# Patient Record
Sex: Female | Born: 1955 | Race: White | Hispanic: No | Marital: Married
Health system: Northeastern US, Academic
[De-identification: ages and names within clinical notes are randomized; demographics above are authoritative.]

---

## 2016-02-22 LAB — CREATININE W/ EGFR (EXT)
Creatinine (EXT): 0.73 mg/dL (ref 0.50–1.20)
GFR Estimated (Calc) (EXT): >59 mL/min/{1.73_m2} (ref >59)

## 2016-10-14 LAB — CREATININE W/ EGFR (EXT)
Creatinine (EXT): 0.8 mg/dL (ref 0.50–1.20)
GFR Estimated (Calc) (EXT): 81 mL/min/{1.73_m2} (ref >59)

## 2017-08-28 LAB — BMP (EXT)
Anion Gap (EXT): 11 mmol/L (ref 7–17)
BUN (EXT): 13 mg/dL (ref 6–23)
CO2 (EXT): 25 mmol/L (ref 22–31)
CalciumCalcium (EXT): 10.2 mg/dL (ref 8.8–10.7)
Chloride (EXT): 104 mmol/L (ref 98–107)
Creatinine (EXT): 0.77 mg/dL (ref 0.50–1.20)
GFR Estimated (Calc) (EXT): 84 mL/min/{1.73_m2} (ref >59)
Glucose (EXT): 81 mg/dL (ref 70–100)
Potassium (EXT): 4.3 mmol/L (ref 3.4–5.1)
Sodium (EXT): 140 mmol/L (ref 136–145)

## 2019-05-20 LAB — UNMAPPED LAB RESULTS: CalciumCalcium (EXT): 10.1 mg/dL (ref 8.8–10.7)

## 2019-12-28 LAB — BMP (EXT)
Anion Gap (EXT): 7 mmol/L (ref 7–17)
BUN (EXT): 16 mg/dL (ref 6–23)
CO2 (EXT): 28 mmol/L (ref 22–31)
CalciumCalcium (EXT): 10.4 mg/dL (ref 8.8–10.7)
Chloride (EXT): 106 mmol/L (ref 98–107)
Creatinine (EXT): 0.78 mg/dL (ref 0.50–1.20)
GFR Estimated (Calc) (EXT): 81 mL/min/{1.73_m2} (ref >59)
Glucose (EXT): 75 mg/dL (ref 70–100)
Potassium (EXT): 4 mmol/L (ref 3.4–5.1)
Sodium (EXT): 141 mmol/L (ref 136–145)

## 2020-09-14 LAB — BMP (EXT)
Anion Gap (EXT): 6 mmol/L — ABNORMAL LOW (ref 7–17)
BUN (EXT): 20 mg/dL (ref 6–23)
CO2 (EXT): 29 mmol/L (ref 22–31)
CalciumCalcium (EXT): 10.4 mg/dL (ref 8.8–10.7)
Chloride (EXT): 106 mmol/L (ref 98–107)
Creatinine (EXT): 0.8 mg/dL (ref 0.50–1.20)
GFR Estimated (Calc) (EXT): 83 mL/min/{1.73_m2} (ref >59)
Glucose (EXT): 89 mg/dL (ref 70–100)
Potassium (EXT): 4.1 mmol/L (ref 3.4–5.1)
Sodium (EXT): 141 mmol/L (ref 136–145)

## 2021-01-15 ENCOUNTER — Encounter

## 2021-01-15 ENCOUNTER — Encounter (HOSPITAL_BASED_OUTPATIENT_CLINIC_OR_DEPARTMENT_OTHER): Admitting: Dermatology

## 2021-01-31 ENCOUNTER — Encounter (HOSPITAL_BASED_OUTPATIENT_CLINIC_OR_DEPARTMENT_OTHER)

## 2021-01-31 ENCOUNTER — Ambulatory Visit (HOSPITAL_BASED_OUTPATIENT_CLINIC_OR_DEPARTMENT_OTHER): Admitting: Dermatology

## 2021-04-09 ENCOUNTER — Encounter (HOSPITAL_BASED_OUTPATIENT_CLINIC_OR_DEPARTMENT_OTHER)

## 2021-04-09 ENCOUNTER — Encounter

## 2021-04-09 ENCOUNTER — Telehealth (HOSPITAL_BASED_OUTPATIENT_CLINIC_OR_DEPARTMENT_OTHER): Admitting: Gastroenterology

## 2021-04-09 NOTE — Telephone Encounter (Signed)
Del from Dr Gretchen Short office call req app for this pt, DR.Isaias Cowman wants DR,Rahaman to see this pt they are going to fax the referral to this department, pt # [phone], thank you

## 2021-04-12 ENCOUNTER — Other Ambulatory Visit (HOSPITAL_BASED_OUTPATIENT_CLINIC_OR_DEPARTMENT_OTHER)

## 2021-04-12 ENCOUNTER — Other Ambulatory Visit (HOSPITAL_BASED_OUTPATIENT_CLINIC_OR_DEPARTMENT_OTHER): Admitting: Gastroenterology

## 2021-04-19 ENCOUNTER — Telehealth (HOSPITAL_BASED_OUTPATIENT_CLINIC_OR_DEPARTMENT_OTHER): Admitting: Gastroenterology

## 2021-04-19 NOTE — Telephone Encounter (Signed)
Pt is calling to reschedule her appt for 04/29/2021 @9 :20 a.m she said that appt is to early coming from [facility] she can do a late morning appt if possible. pt cares for her father bc [phone] thank you

## 2021-04-24 NOTE — Telephone Encounter (Signed)
Please c/b pt to reschedule, she can not make that Monday, any other day is better, thanks

## 2021-04-24 NOTE — Telephone Encounter (Signed)
pt is returning her miss call from the clinic is returning call from [facility] [phone] thank you

## 2021-04-24 NOTE — Telephone Encounter (Signed)
lvm to call back to reschedule appt

## 2021-04-29 ENCOUNTER — Ambulatory Visit: Payer: MEDICARE | Attending: Gastroenterology

## 2021-06-17 ENCOUNTER — Ambulatory Visit: Payer: MEDICARE | Attending: Gastroenterology

## 2021-07-08 ENCOUNTER — Ambulatory Visit: Payer: MEDICARE | Attending: Gastroenterology

## 2021-07-08 ENCOUNTER — Ambulatory Visit: Payer: MEDICARE | Attending: Family

## 2021-07-17 ENCOUNTER — Ambulatory Visit: Payer: MEDICARE | Attending: Gastroenterology

## 2021-07-17 ENCOUNTER — Encounter

## 2021-08-07 ENCOUNTER — Ambulatory Visit: Payer: MEDICARE | Attending: Otolaryngology

## 2021-08-07 ENCOUNTER — Ambulatory Visit: Payer: MEDICARE

## 2022-04-19 LAB — BMP (EXT)
Anion Gap (EXT): 7 mmol/L (ref 7–17)
BUN (EXT): 20 mg/dL (ref 6–23)
CO2 (EXT): 27 mmol/L (ref 22–31)
CalciumCalcium (EXT): 10.3 mg/dL (ref 8.8–10.7)
Chloride (EXT): 106 mmol/L (ref 98–107)
Creatinine (EXT): 0.71 mg/dL (ref 0.50–1.20)
GFR Estimated (Calc) (EXT): 95 mL/min/{1.73_m2} (ref >59)
Glucose (EXT): 96 mg/dL (ref 70–100)
Potassium (EXT): 4.4 mmol/L (ref 3.4–5.1)
Sodium (EXT): 140 mmol/L (ref 136–145)

## 2022-07-10 ENCOUNTER — Institutional Professional Consult (permissible substitution): Admit: 2022-07-10 | Discharge: 2022-07-10 | Payer: MEDICARE | Attending: Audiologist

## 2022-07-10 ENCOUNTER — Ambulatory Visit: Admit: 2022-07-10 | Discharge: 2022-07-10 | Payer: MEDICARE | Attending: Otolaryngology

## 2022-07-10 DIAGNOSIS — H9202 Otalgia, left ear: Secondary | ICD-10-CM

## 2022-07-10 DIAGNOSIS — H93293 Other abnormal auditory perceptions, bilateral: Secondary | ICD-10-CM

## 2022-07-10 NOTE — Progress Notes (Signed)
Name: Shelly Thompson  DOB: 1955-03-03  Age: 67 y.o.  Date of Evaluation: 07/10/2022    REASON FOR VISIT: Shelly Thompson is seen today for an audiologic evaluation in conjunction with Otolaryngologist, Dr. Lorenda Cahill, secondary to otalgia and vertigo. Patient reported intermittent otalgia (L>R) since having her left ear cleaned under anesthesia ~7 years ago. 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 Patient reported room-spinning vertigo that is positional in nature for several years. She denied otorrhea, tinnitus, hearing concerns, and history of noise exposure. Patient reported a family history of hearing loss in her father secondary to noise exposure.    EVALUATION  See audiogram    AUDIOMETRIC FINDINGS:  Otoscopic Evaluation:  Right Ear: clear ear canal with tympanic membrane visualized  Left Ear: clear ear canal with tympanic membrane visualized    Immitance Measures: 226 Hz  Right Ear: Normal ear canal volume, middle ear pressure, and tympanic membrane mobility, consistent with normal middle ear function  Left Ear: Normal ear canal volume, middle ear pressure, and tympanic membrane mobility, consistent with normal middle ear function    Acoustic reflexes were deferred as patient reports hyperacusis as well as headache today.    Test Technique: Pure Tone Audiometry via headphones    Reliability: Good    Pure Tone Audiometry: Results obtained today suggest hearing within normal limits from 0.25-8 kHz in both ears.    Speech Audiometry:  Right Ear: Speech Recognition Threshold (SRT) was obtained at 15 dBHL Word recognition score was excellent at a normal conversational speech level  Left Ear: Speech Recognition Threshold (SRT) was obtained at 15 dBHL Word recognition score was excellent at a normal conversational speech level    IMPRESSIONS:  Hearing sensitivity appears adequate for communication needs at this time.     RECOMMENDATIONS:  Follow-up with Dr. Lorenda Cahill as scheduled for today.   Repeat audiologic  evaluation per otologic plan of care, or sooner if a change in hearing is noted.    Today's results and recommendations were discussed with the patient, who verbalized an understanding and is in agreement with the plan of care. Questions were addressed and the patient was encouraged to contact our department should concerns arise.      Dustin Folks, AuD, Landscape architect  [facility]  [address], [address]

## 2022-07-10 NOTE — Progress Notes (Signed)
DEPARTMENT OF OTOLARYNGOLOGY - HEAD AND NECK SURGERY  [address]  [address]  [place] [address]    Date of Service: 07/10/2022  Patient: Shelly Thompson  DOB: 11-May-1955  MRN#: [id number]    History of Present Illness:  Shelly Thompson is a 67 y.o. female who presents for evaluation of ear discomfort. SHe reports having to have her left cleaned under anesthesia in 2016.  Since that time she has had chronic left ear discomfort and fullness with intermittent otalgia. SHe also reports episodes of positional vertigo, occurring several times per week for many years.  SHe denies hearing change with her vertigo. She has a hx of migraine and motion intolerance  Patient denies : hearing loss and tinnitus    History reviewed. No pertinent past medical history.  History reviewed. No pertinent surgical history.  Social History     Tobacco Use    Smoking status: Not on file    Smokeless tobacco: Not on file   Substance Use Topics    Alcohol use: Not on file     No family history on file.    Allergies:  Latex, Prednisone, Gluten protein, and Peanut    Medications:  Current Outpatient Medications   Medication Instructions    acetaminophen (TYLENOL 8 HOUR) 650 mg, oral, Every 8 hours PRN    esomeprazole (NexIUM) 20 mg DR capsule 1 capsule, oral, Daily    fexofenadine (Allegra Allergy) 180 mg tablet 1 tablet, oral, Daily    fluconazole (Diflucan) 150 mg tablet TAKE 1 TABLET BY MOUTH EVERY DAY AS DIRECTED FOR 1 DAY    folic acid (FOLVITE) 1,333 mcg, oral, Daily RT    levothyroxine (SYNTHROID, LEVOXYL) 125 mcg, oral, Daily RT    lithium ER (LITHOBID) 600 mg, oral, Daily RT    magnesium 100 mg, oral, Daily RT    metroNIDAZOLE (Metrocream) 0.75 % cream Topical, 2 times daily    minocycline 100 mg capsule oral, Daily    rosuvastatin (CRESTOR) 5 mg, oral, Daily    therapeutic multivitamin-iron-minerals (Theragran-M) tablet 1 tablet, oral, Daily RT       Physical exam:  Constitutional: Well appearing, in no acute distress.  Communicates easily and understands our conversation.  Neurotology exam: A&Ox3. Facial strength HB grade I. EOMI. CN II-XII grossly intact. EOMI. Normal FTN. Head thrust without nystagmus. Rhomberg test negative. Fukuda stepping test without rotation or drifting. Dix-hallpike maneuver negative for nystagmus but she complains of vertigo in left ear down position  Ears: Examined with otomicroscopy  Right: Pinna normal, ear canal normal, TM intact and normal, Rinne +  Left: Pinna normal, ear canal normal, TM intact and normal, Rinne+  Weber midline    Eyes: Normal lids and periorbital structures.   Neck: No cervical adenopathy or masses  Musculoskeletal: Left posterior neck stiffness/tenderness. No TMJ tenderness  Respiratory: No respiratory distress, no stridor, or nasal flaring  Psychiatric: Normal behavior. Cooperative with exam    Review of Data/Medical Documents:  Audiogram was personally reviewed, which showed normal hearing bilaterally    Procedures:    Assessment/Plan:  1. Left ear pain  Referred from cervicalgia    2. Cervicalgia    - Ambulatory referral to Physical Therapy; Future    3. Vertigo  If not improving with PT, we will consider vestibular testing      Follow up in about 2 months (around 09/09/2022) for follow up, sooner as needed.      Elijio Miles, MD  Otology, Neurotology and  [facility]  P: [phone]  F: [phone]

## 2022-09-11 ENCOUNTER — Ambulatory Visit: Payer: MEDICARE | Attending: Otolaryngology

## 2022-10-03 NOTE — Telephone Encounter (Signed)
Patient calling in to cancel 9/12 appointment with Dr. Lorenda Cahill.     Please advise.  Patient has been having hip problems and will be having surgery on October 25, and would need to R/S for the new year.   Patient has been going to the massages and has been helping vertigo and neck and ear pain has improved.    Patient has been rescheduled to 03/04/23 at 11am with Dr. Lorenda Cahill      Call back: [phone]

## 2022-10-09 ENCOUNTER — Ambulatory Visit: Payer: MEDICARE | Attending: Otolaryngology

## 2023-01-05 LAB — UNMAPPED LAB RESULTS: CalciumCalcium (EXT): 10.1 mg/dL (ref 8.8–10.7)

## 2023-01-14 ENCOUNTER — Ambulatory Visit: Admit: 2023-01-14 | Discharge: 2023-01-14 | Payer: MEDICARE | Attending: Otolaryngology

## 2023-01-14 DIAGNOSIS — R42 Dizziness and giddiness: Secondary | ICD-10-CM

## 2023-01-14 NOTE — Progress Notes (Signed)
DEPARTMENT OF OTOLARYNGOLOGY - HEAD AND NECK SURGERY  [address]  [address]  [place] [address]    Date of Service: 01/14/2023  Patient: Shelly Thompson  DOB: 1956-01-12  MRN#: [id number]    History of Present Illness:  Shelly Thompson is a 67 y.o. female who returns for dizziness and ear discomfort. She tried massage therapy and acupuncture for her neck which helped significantly. She was unable to find a physical therapist. However she stopped going 7 weeks ago due to hip surgery. Of note, she is going to PT for her hip. She reports intermittent "phantom smells" of cigarette smoke, which started 1.5 years ago after she had COVID infection. She states the last episode was around 3 days ago. She states her mother had a neurologic condition which she states was generalized dystonia versus corticobasilar syndrome versus apraxia verus Parkinson's. She has a hx of migraine and motion intolerance.     History reviewed. No pertinent past medical history.  History reviewed. No pertinent surgical history.  Social History     Tobacco Use    Smoking status: Not on file    Smokeless tobacco: Not on file   Substance Use Topics    Alcohol use: Not on file     No family history on file.    Allergies:  Latex, Prednisone, Gluten protein, and Peanut    Medications:  Current Outpatient Medications   Medication Instructions    acetaminophen (TYLENOL 8 HOUR) 650 mg, oral, Every 8 hours PRN    esomeprazole (NexIUM) 20 mg DR capsule 1 capsule, oral, Daily    fexofenadine (Allegra Allergy) 180 mg tablet 1 tablet, oral, Daily    fluconazole (Diflucan) 150 mg tablet TAKE 1 TABLET BY MOUTH EVERY DAY AS DIRECTED FOR 1 DAY    folic acid (FOLVITE) 1,333 mcg, oral, Daily RT    levothyroxine (SYNTHROID, LEVOXYL) 125 mcg, oral, Daily RT    lithium ER (LITHOBID) 600 mg, oral, Daily RT    magnesium 100 mg, oral, Daily RT    metroNIDAZOLE (Metrocream) 0.75 % cream Topical, 2 times daily    minocycline 100 mg capsule oral, Daily     rosuvastatin (CRESTOR) 5 mg, oral, Daily    therapeutic multivitamin-iron-minerals (Theragran-M) tablet 1 tablet, oral, Daily RT       Physical exam:  Constitutional: Well appearing, in no acute distress. Communicates easily and understands our conversation.  Neurotology exam: A&Ox3. Facial strength HB grade I. CN II-XII grossly intact. EOMI. Normal FTN. Head thrust without nystagmus. Rhomberg test negative. Fukuda stepping test without rotation or drifting. Dix-hallpike maneuver negative for nystagmus but she complains of dizziness in left ear down position  Ears: Examined with otomicroscopy  Right: Pinna normal, ear canal normal, TM intact and normal  Left: Pinna normal, ear canal normal, TM intact and normal  Eyes: Normal lids and periorbital structures.   Neck: No cervical adenopathy or masses  Musculoskeletal: Left posterior neck stiffness/tenderness.   Respiratory: No respiratory distress, no stridor, or nasal flaring  Psychiatric: Normal behavior. Cooperative with exam    Review of Data/Medical Documents:  Audiogram was personally reviewed, which showed normal hearing bilaterally    Procedures:    Assessment/Plan:  1. Dizziness  Most likely cervicogenic    2. Cervicalgia  Recommend continue massage therapy. She could consider PT however she is not interested at this time.     3. Left ear pain  Most likely cervicogenic, continue treatment    4. Smell disturbance  Differential includes seizure disorder. Recommended she follow up with Neurology for further evaluation. She states she is unable to tolerate MRI due to claustrophobia, will defer imaging recommendations to Neurology.     Follow up if symptoms worsen or fail to improve.      Elijio Miles, MD  Otology, Neurotology and [facility] Surgery  [facility]  P: [phone]  F: [phone]

## 2023-01-15 LAB — UNMAPPED LAB RESULTS: CalciumCalcium (EXT): 10.3 mg/dL (ref 8.8–10.7)

## 2023-01-23 NOTE — Telephone Encounter (Signed)
Pt called in wanting to have the physical therapy order mentioned by Dr Lorenda Cahill to be faxed to [facility] attn: Riki Rusk, fax#[phone]    Pt's [phone]     Thank you

## 2023-01-30 NOTE — Telephone Encounter (Signed)
Patient calling back said she saw appointment in portal  and now it is canceled did  not know what was going on.she will be available to  be contacted after 11:30 am today.    Thank you     [phone] (home)

## 2023-01-30 NOTE — Telephone Encounter (Signed)
Department Name: ENT    Patient: Shelly Thompson  MRN: [id number]  Agent: Veva Holes    [facility] Scheduling Message    Patient requesting call back from: DR Lorenda Cahill    In regards to: patient been having ear pain and wanted advice before making appointment     Best call back number: [phone]

## 2023-02-02 NOTE — Telephone Encounter (Signed)
Patient calling back see needs an appointment still having ear pain hearing loss and getting dizziness. From the ear drum she thinks is. And there was appointment on 02/04/2023 that was canceled  she can not have an afternoon. She will be available  after 11:30 today    Thank you     [phone]

## 2023-02-04 ENCOUNTER — Ambulatory Visit: Payer: MEDICARE | Attending: Otolaryngology

## 2023-02-04 ENCOUNTER — Ambulatory Visit: Payer: MEDICARE

## 2023-02-04 NOTE — Telephone Encounter (Signed)
Patient with viral infection last week. PCP concerned for TM perforation on exam. Patient denies otalgia, otorrhea but does report ear fullness, crackling in ear, and distorted hearing. She is aware she may need to be added on for audio for next week's appt which was alrdy scheduled.

## 2023-02-12 ENCOUNTER — Ambulatory Visit: Admit: 2023-02-12 | Discharge: 2023-02-12 | Payer: MEDICARE | Attending: Otolaryngology

## 2023-02-12 DIAGNOSIS — H6591 Unspecified nonsuppurative otitis media, right ear: Secondary | ICD-10-CM

## 2023-02-12 NOTE — Progress Notes (Signed)
DEPARTMENT OF OTOLARYNGOLOGY - HEAD AND NECK SURGERY  [address]  [address]  [place] [address]    Date of Service: 02/12/2023  Patient: Shelly Thompson  DOB: Jun 06, 1955  MRN#: [id number]    History of Present Illness:  Shelly Thompson is a 68 y.o. female who presents for evaluation of right ear blockage/fullness and tinnitus in setting of a cold 10 days ago. She denies otalgia, otorrhea. She reports slight imbalance occasionally. She has been going to PT for her neck which has been improving her otalgia. She states her dizziness is also overall improved.       History reviewed. No pertinent past medical history.  History reviewed. No pertinent surgical history.  Social History     Tobacco Use    Smoking status: Not on file    Smokeless tobacco: Not on file   Substance Use Topics    Alcohol use: Not on file     No family history on file.    Allergies:  Latex, Prednisone, Gluten protein, and Peanut    Medications:  Current Outpatient Medications   Medication Instructions    acetaminophen (TYLENOL 8 HOUR) 650 mg, oral, Every 8 hours PRN    esomeprazole (NexIUM) 20 mg DR capsule 1 capsule, oral, Daily    fexofenadine (Allegra Allergy) 180 mg tablet 1 tablet, oral, Daily    fluconazole (Diflucan) 150 mg tablet TAKE 1 TABLET BY MOUTH EVERY DAY AS DIRECTED FOR 1 DAY    folic acid (FOLVITE) 1,333 mcg, oral, Daily RT    levothyroxine (SYNTHROID, LEVOXYL) 125 mcg, oral, Daily RT    lithium ER (LITHOBID) 600 mg, oral, Daily RT    magnesium 100 mg, oral, Daily RT    metroNIDAZOLE (Metrocream) 0.75 % cream Topical, 2 times daily    minocycline 100 mg capsule oral, Daily    rosuvastatin (CRESTOR) 5 mg, oral, Daily    therapeutic multivitamin-iron-minerals (Theragran-M) tablet 1 tablet, oral, Daily RT       Physical exam:  Constitutional: Well appearing, in no acute distress. Communicates easily and understands our conversation.  Neurotology exam: A&Ox3. Facial strength HB grade I.  Ears: Examined with  otomicroscopy  Right: Pinna normal, ear canal normal, TM intact with serous middle ear effusion  Left: Pinna normal, ear canal normal, TM intact and normal  Weber midline, Rinne AC>BC on the right    Eyes: Normal lids and periorbital structures.   Respiratory: No respiratory distress, no stridor, or nasal flaring  Psychiatric: Normal behavior. Cooperative with exam    Review of Data/Medical Documents:    Procedures:    Assessment/Plan:  1. Middle ear effusion, right  Recommend nasal saline. She states she is unable to tolerate Flonase. If does not resolve at follow up, could consider mx/tube. Will also obtain CT scan for further evaluation  - CT TEMPORAL BONES W CONTRAST; Future  - Creatinine, Serum; Future  - CT SINUS WO CONTRAST; Future  - Creatinine, Serum    2. Smell disturbance  She is unable to tolerate MRI due to claustrophobia, but will including contrast on imaging order to evaluate for any underlying lesion.       Follow up in about 4 weeks (around 03/12/2023) for visit with audiogram, sooner as needed.      Elijio Miles, MD  Otology, Neurotology and [facility] Surgery  [facility]  P: [phone]  F: [phone]

## 2023-02-17 NOTE — Telephone Encounter (Signed)
Patient is requesting for the office to schedule CT scan to be done at [facility] in [place], [place].    Best call back: [phone]

## 2023-02-17 NOTE — Telephone Encounter (Signed)
Patient called with an update that their sore throat is back and their nose/ear symptoms haven't improved. Patient is asking if they should be on an antibiotic because a few weeks ago that seemed to help their symptoms.    Pharmacy: [facility] on [address] in [place], [place]    Best call back: [phone]

## 2023-02-17 NOTE — Telephone Encounter (Addendum)
Patient called to reschedule panel appointment on 2/6 because they are not able to come then. First available panel appointment not until 4/9. Patient requests a call back to schedule because they can't wait until April. Patient prefers 10:30, 10:45 or 11am.    Best call back: [phone]

## 2023-02-18 NOTE — Telephone Encounter (Signed)
Sore throat since last Thursday, improved today. Did have brief episode of coughing, no fever. Observation is recommended at this time. She will reach out if symptoms worsen. Patient is in agreement with this plan.

## 2023-03-04 ENCOUNTER — Ambulatory Visit: Payer: MEDICARE | Attending: Otolaryngology

## 2023-03-05 ENCOUNTER — Ambulatory Visit: Payer: MEDICARE

## 2023-03-05 ENCOUNTER — Ambulatory Visit: Payer: MEDICARE | Attending: Otolaryngology

## 2023-03-18 ENCOUNTER — Ambulatory Visit: Admit: 2023-03-18 | Discharge: 2023-03-18 | Payer: MEDICARE | Attending: Audiologist

## 2023-03-18 ENCOUNTER — Ambulatory Visit: Admit: 2023-03-18 | Discharge: 2023-03-18 | Payer: MEDICARE | Attending: Otolaryngology

## 2023-03-18 DIAGNOSIS — H6591 Unspecified nonsuppurative otitis media, right ear: Secondary | ICD-10-CM

## 2023-03-18 DIAGNOSIS — H93293 Other abnormal auditory perceptions, bilateral: Secondary | ICD-10-CM

## 2023-03-18 NOTE — Progress Notes (Signed)
DEPARTMENT OF OTOLARYNGOLOGY - HEAD AND NECK SURGERY  [address]  [address]  [place] [address]    Date of Service: 03/18/2023  Patient: Shelly Thompson  DOB: 21-Nov-1955  MRN#: [id number]    History of Present Illness:  Shelly Thompson is a 68 y.o. female who presents for follow up of right middle ear effusion.    Last seen 02/12/23 for right ear blockage/fullness and tinnitus following cold 10 days prior.   Patient reports her right ear blockage has resolved. Hearing on the right subjectively improved. She denies pulsatile tinnitus, autiophony, vertigo or  imbalance. Has intermittent left otalgia for years. Intermittent slight vertigo and balance issues, improved with physical therapy.    Patient denies : hearing loss, change in hearing, ear blockage, tinnitus, otorrhea, migraine, and autophony    History reviewed. No pertinent past medical history.  History reviewed. No pertinent surgical history.  Social History     Tobacco Use    Smoking status: Never    Smokeless tobacco: Never   Substance Use Topics    Alcohol use: Never     No family history on file.    Allergies:  Latex; Lurasidone hcl; Peanut; Prednisone; Zolpidem; Armodafinil; Quetiapine; Aripiprazole; Azelastine; Divalproex; Divalproex sodium; Gloves, latex with aloe vera; Gluten protein; Methylphenidate; Mirtazapine; Morphine; Nefazodone; Oxcarbazepine; Peanut oil; Pramipexole; Propranolol; Risperidone; Topiramate; Zonisamide; Latex, natural rubber; and Sulfa (sulfonamide antibiotics)    Medications:  Current Outpatient Medications   Medication Instructions    acetaminophen (TYLENOL 8 HOUR) 650 mg, oral, Every 8 hours PRN    albuterol 90 mcg/actuation inhaler TAKE 2 PUFFS BY MOUTH EVERY 4 HOURS AS NEEDED/Dx: J45.909 asthma    amoxicillin (Amoxil) 500 mg tablet amoxicillin 500 mg tablet   4 TABLETS BY MOUTH ONE TIME, ONE HOUR PRIOR TO DENTAL PROCEDURE    aspirin 81 mg, oral    benzonatate (Tessalon) 100 mg capsule Take 1 capsule 3 times a day by  oral route for 7 days.    bisacodyl (DULCOLAX) 10 mg, oral, Daily PRN    calcium carbonate (Os-Cal) 500 mg calcium (1,250 mg) chewable tablet 1 tablet, oral, Daily    cholecalciferol (Vitamin D-3) 25 MCG (1000 UT) capsule 1 capsule, oral, Daily    ciprofloxacin-dexamethasone (Ciprodex) otic suspension Ciprodex 0.3 %-0.1 % ear drops,suspension    docusate sodium (COLACE) 100 mg, oral, Daily    EPINEPHrine (EPIPEN) 0.3 mg, intramuscular, Once as needed    esomeprazole (NexIUM) 20 mg DR capsule 1 capsule, oral, Daily    fexofenadine (Allegra Allergy) 180 mg tablet 1 tablet, oral, Daily    fluconazole (Diflucan) 150 mg tablet TAKE 1 TABLET BY MOUTH EVERY DAY AS DIRECTED FOR 1 DAY    fluoride, sodium, (PreviDent 5000 Booster Plus) 1.1 % paste     folic acid (FOLVITE) 1,333 mcg, oral, Daily RT    hylan (SYNVISC) 48 mg, intra-articular    ketoconazole (NIZOral) 2 % shampoo SHAMPOO EVERY OTHER DAY    levothyroxine (SYNTHROID, LEVOXYL) 125 mcg, oral, Daily RT    lithium ER (LITHOBID) 600 mg, oral, Daily RT    magnesium 100 mg, oral, Daily RT    meloxicam (MOBIC) 7.5 mg, oral    metroNIDAZOLE (Metrocream) 0.75 % cream Topical, 2 times daily    minocycline 100 mg capsule oral, Daily    ondansetron ODT (ZOFRAN-ODT) 4 mg, oral, Every 8 hours PRN    polyethylene glycol (GLYCOLAX) 17 g, oral, Daily PRN    rosuvastatin (CRESTOR) 5 mg, oral, Daily  senna 8.6 mg tablet 1 tablet, oral, Nightly    therapeutic multivitamin-iron-minerals (Theragran-M) tablet 1 tablet, oral, Daily RT       Physical exam:  Constitutional: Well appearing, in no acute distress. Communicates easily and understands our conversation.  Neurotology exam: A&Ox3. Facial strength HB grade I. EOMI. CN II-XII grossly intact. EOMI. Normal FTN. Head thrust without nystagmus. Rhomberg test negative. Fukuda stepping test without rotation or drifting.  Ears: Examined with otomicroscopy  Right: Pinna normal, ear canal normal, TM intact and normal  Left: Pinna normal, ear  canal normal, TM intact and normal  Nose: Dorsum straight. Septum midline. Turbinates normal. Mucosa normal  Oral cavity/oropharynx: Lips normal, buccal mucosa normal, no visible lesions or ulcers of tongue or floor of mouth, uvula is midline, hard and soft palate normal. Posterior pharyngeal wall normal. No tonsillar hypertrophy.   Eyes: Normal lids and periorbital structures.   Neck: No cervical adenopathy or masses  Musculoskeletal: No neck stiffness/tenderness. Left posterior neck tenderness   Respiratory: No respiratory distress, no stridor, or nasal flaring  Psychiatric: Normal behavior. Cooperative with exam    Review of Data/Medical Documents:  Audiogram was personally reviewed, showing normal hearing bilaterally    CT temporal bone w/ contrast on 03/10/23  shows well aerated middle ears bilaterally with mild fluid density in right mastoid, very small left SSCD    Procedures:  None    Assessment/Plan:  48F following up for right middle ear effusion. This has resolved. Discussed her CT temporal bone w contrast from 03/10/23. 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 This can be observed without intervention for now.    1. Middle ear effusion, right  - Resolved on follow up     2. Left ear pain  - Left posterior neck tenderness on exam. 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    Marisue Ivan, MD    Elijio Miles, MD  Otology, Neurotology and [facility] Surgery  [facility]  P: [phone]  F: [phone]

## 2023-03-18 NOTE — Progress Notes (Signed)
AUDIOLOGIC EVALUATION    [facility], [address]  [address]    Name: Shelly Thompson  DOB: 12/11/1955  Age: 68 y.o.  Date of Evaluation: 03/18/2023    REASON FOR VISIT:   Patient was seen for an audiologic evaluation in conjunction with ENT. Last AE completed 07/10/2022, which revealed hearing WNL, bilaterally. Patient experienced right aural fullness, hearing loss, and tinnitus in the setting of a cold in January 2025, and she was diagnosed with right MEE. She reported hearing has improved in the right ear since then. She continues to have dizziness and intermittent left otalgia; however, she reported both have improved.    EVALUATION  See audiogram under "Procedures."    AUDIOMETRIC FINDINGS:  Otoscopic Evaluation:  Right Ear: clear ear canal with tympanic membrane visualized  Left Ear: clear ear canal with tympanic membrane visualized    Immittance Measures:   226 Hz  Right Ear: Type A; Normal ear canal volume, middle ear pressure, and tympanic membrane mobility, suggestive of normal middle ear function.  Left Ear: Type A; Normal ear canal volume, middle ear pressure, and tympanic membrane mobility, suggestive of normal middle ear function.    DNT acoustic reflexes    Test Technique:   Pure Tone Audiometry via inserts    Reliability:   Good    Pure Tone Audiometry:   Right Ear: Hearing WNL  Left Ear: Hearing WNL    Speech Audiometry:  Right Ear: Speech Reception Threshold (SRT) was in agreement with pure tone average (PTA). Word recognition score was excellent in quiet when words were presented at an elevated level.   Left Ear: Speech Reception Threshold (SRT) was in agreement with pure tone average (PTA). Word recognition score was excellent in quiet when words were presented at an elevated level.     IMPRESSIONS:  Hearing within normal limits, bilaterally. Hearing is essentially stable bilaterally re: 07/10/2022 audio.     RECOMMENDATIONS:  Follow-up with ENT, as scheduled.  Repeat audiologic evaluation per otologic  plan of care, sooner if concerns arise.      Today's results and recommendations were discussed with patient. Patient verbalized understanding and was in agreement with the plan of care. Questions were addressed, and the patient was encouraged to contact our department should any questions/concerns arise.        Herma Mering, AuD, CCC-A  [facility]   [address]  [place], [place] [address]

## 2023-03-25 IMAGING — MR RM CRANIO
1 series · 20 of 20 positions shown · non-contrast
Comparison: none

[Series 8: axial_difusao_0_(id)_tracew · U · 2 acquisitions, 20 frames shown]
[im 1/2]
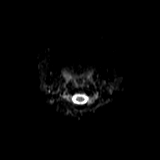
[im 1/2]
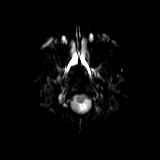
[im 1/2]
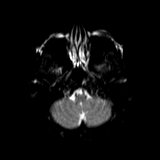
[im 1/2]
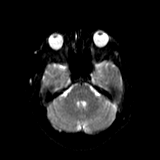
[im 1/2]
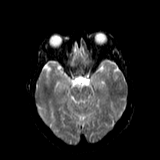
[im 1/2]
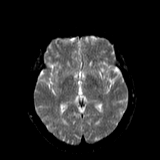
[im 1/2]
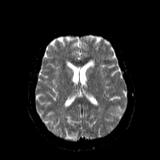
[im 1/2]
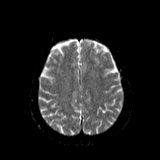
[im 1/2]
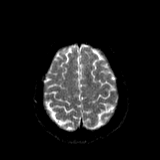
[im 1/2]
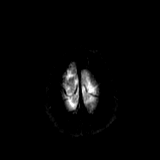
[im 2/2]
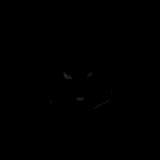
[im 2/2]
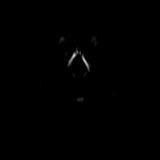
[im 2/2]
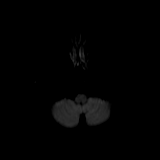
[im 2/2]
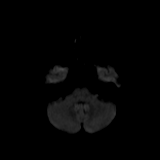
[im 2/2]
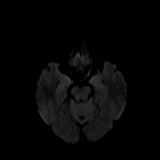
[im 2/2]
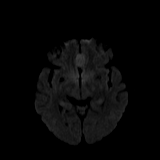
[im 2/2]
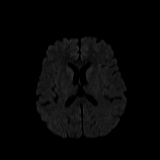
[im 2/2]
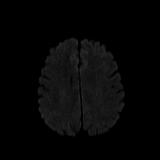
[im 2/2]
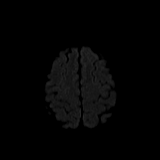
[im 2/2]
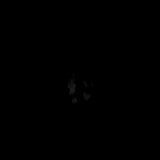

[20 of 20 positions shown; findings below may reference images not displayed]

RESSONÂNCIA MAGNÉTICA DO CRÂNIO

TÉCNICA:
Exame realizado em equipamento de ressonância magnética com sequências, ponderações e planos específicos para o segmento de interesse, antes e após a administração endovenosa do meio de contraste.

RESULTADO:
Formação expansiva extra-axial com base de implantação dural no plano etmoidal exibindo contornos lobulados e impregnação heterogênea ao meio de contraste, medindo 2,4 x 2,1 x 2,0 cm (CC x AP x T). Determina compressão e deslocamento lateral dos giros olfatórios. Não há edema do parênquima cerebral adjacente.
Redução volumétrica cerebral difusa, caracterizada por acentuação dos sulcos e das cisternas cerebrais, sem predomínio lobar, associada à ectasia compensatória do sistema ventricular supratentorial.
Focos de hiperintensidade de sinal nas sequências T2 e FLAIR, na substância branca periventricular e centros semiovais, que não exercem efeito expansivo ou atrófico, de provável natureza microvascular.
Não há evidência de processo expansivo parenquimatoso, coleções líquidas extra-axiais, ventriculomegalia hipertensiva, hemorragia intraparenquimatosa aguda / subaguda, desvio das estruturas da linha média ou apagamento das cisternas da base.
Fluxo habitual ao nível dos grandes ramos arteriais intracranianos dos sistemas vertebrobasilar e carotídeo, segundo critério Spin Echo.
Não há área de restrição à livre difusão das moléculas de água evidenciada pela sequência Echo Planar.

CONCLUSÃO:
Provável meningioma do plano etmoidal. Não dispusemos de imagens de exames anteriores para adequada análise comparativa.
Redução volumétrica cerebral difusa, sem predomínio lobar.
Focos de hiperintensidade de sinal nas sequências T2 e FLAIR na substância branca de ambos os hemisférios cerebrais, de provável natureza microvascular ([place] [address]).

## 2023-04-25 LAB — BMP (EXT)
Anion Gap (EXT): 12 mmol/L (ref 7–17)
BUN (EXT): 18 mg/dL (ref 6–23)
CO2 (EXT): 24 mmol/L (ref 22–31)
CalciumCalcium (EXT): 10.4 mg/dL (ref 8.8–10.7)
Chloride (EXT): 107 mmol/L (ref 98–107)
Creatinine (EXT): 0.83 mg/dL (ref 0.50–1.20)
GFR Estimated (Calc) (EXT): 78 mL/min/1.73m2 (ref >59)
Glucose (EXT): 88 mg/dL (ref 70–100)
Potassium (EXT): 4.7 mmol/L (ref 3.4–5.1)
Sodium (EXT): 143 mmol/L (ref 136–145)

## 2023-05-28 ENCOUNTER — Ambulatory Visit: Payer: MEDICARE | Attending: Neurology

## 2023-07-08 LAB — UNMAPPED LAB RESULTS: CalciumCalcium (EXT): 10.5 mg/dL (ref 8.8–10.7)

## 2023-11-14 LAB — CMP (EXT)
ALT/SGPT (EXT): 17 U/L (ref 10–50)
AST/SGOT (EXT): 15 U/L (ref 10–50)
Albumin (EXT): 4.5 g/dL (ref 3.5–5.2)
Alkaline Phosphatase (EXT): 64 U/L (ref 35–130)
Anion Gap (EXT): 12 mmol/L (ref 7–17)
BUN (EXT): 23 mg/dL (ref 6–23)
Bilirubin, Total (EXT): 0.3 mg/dL (ref 0.0–1.0)
CO2 (EXT): 24 mmol/L (ref 22–31)
CalciumCalcium (EXT): 10.4 mg/dL (ref 8.8–10.7)
Chloride (EXT): 106 mmol/L (ref 98–107)
Creatinine (EXT): 0.82 mg/dL (ref 0.50–1.20)
GFR Estimated (Calc) (EXT): 79 mL/min/1.73m2 (ref >59)
Globulin (EXT): 2.2 g/dL (ref 2.2–4.2)
Glucose (EXT): 87 mg/dL (ref 70–100)
Potassium (EXT): 4 mmol/L (ref 3.4–5.1)
Protein (EXT): 6.7 g/dL (ref 6.4–8.3)
Sodium (EXT): 142 mmol/L (ref 136–145)

## 2023-12-11 ENCOUNTER — Ambulatory Visit: Payer: MEDICARE | Attending: Surgery

## 2023-12-16 ENCOUNTER — Ambulatory Visit: Admit: 2023-12-16 | Discharge: 2023-12-16 | Payer: MEDICARE | Attending: Surgery

## 2023-12-16 NOTE — Progress Notes (Signed)
 Date of Service: 12/16/2023    Patient: Shelly Thompson  DOB: January 30, 1955   MRN#: [id number]      The patient was seen by the resident and medical student under my supervision.  I agree with the assessment and evaluation/work-up.  I personally saw the patient, pertinent records and imaging, and discussed all aspects of the medical decision making.    Gladis Eagles MD       History of Present Illness  68 y/o F with significant PMH of arthritis, SVT, hypothyroidism, and surgical hx including total vaginal hysterectomy and salpingectomy with anterior vaginal repair, referred by OBGYN for evaluation of a R inguinal hernia x 3 weeks.   Patient was carrying laundry up the stairs of her house when she suddenly felt a pop, accompanied by severe pain and a visible mass protruding from the area of pain. States the pain has greatly subsided but she still feels a twinge there.   Hernia is reducible. Pt states she feels it pop out more when she lifts heavy things or strains, and notices more at the end of the day.   Has not noticed any changes in appearance to the area; no increase in size, changes in color, or concerning alterations.   Reports no trauma to the area. Not associated with any changes in BM.   Has also not had any fevers/chills or any infectious sx.   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   Pt states she enjoys long walks and playing with her grandchildren, feels hernia has been limiting her in this regards.   Has never had any postsurgical complications with past surgeries. Not on any anticoagulation.       Past Medical History  Medical History[1]    Past Surgical History  Surgical History[2]    Family History  Family History[3]    Social History  Social History     Socioeconomic History    Marital status: Divorced     Spouse name: Not on file    Number of children: Not on file    Years of  education: Not on file    Highest education level: Not on file   Occupational History    Not on file   Tobacco Use    Smoking status: Never    Smokeless tobacco: Never   Substance and Sexual Activity    Alcohol use: Never    Drug use: Defer    Sexual activity: Defer   Other Topics Concern    Not on file   Social History Narrative    Not on file     Social Determinants of Health     Financial Resource Strain: Low Risk  (11/21/2022)    Received from [facility]    Overall Financial Resource Strain (CARDIA)     Difficulty of Paying Living Expenses: Not hard at all   Recent Concern: Financial Resource Strain - High Risk (11/19/2022)    Received from [facility]    Overall Financial Resource Strain (CARDIA)     Difficulty of Paying Living Expenses: Very hard   Food Insecurity: Unknown (11/21/2022)    Received from [facility]    Hunger Vital Sign     Within the past 12 months, you worried that your food would run out before you got the money to buy more.:  Never true     Ran Out of Food in the Last Year: Not on file   Transportation Needs: No Transportation Needs (11/22/2022)    Received from [facility]    PRAPARE - Transportation     Lack of Transportation (Medical): No     Lack of Transportation (Non-Medical): No   Physical Activity: Not on file   Stress: Not on file   Social Connections: Not on file   Intimate Partner Violence: Unknown (11/21/2022)    Received from [facility]    Humiliation, Afraid, Rape, and Kick questionnaire     Within the last year, have you been afraid of your partner or ex-partner?: No     Emotionally Abused: Not on file     Physically Abused: Not on file     Sexually Abused: Not on file   Housing Stability: Unknown (11/21/2022)    Received from [facility]    Housing Stability Vital Sign     In the last 12 months, was there a time when you were not able to pay the mortgage or rent on time?: No     Number of Times  Moved in the Last Year: Not on file     At any time in the past 12 months, were you homeless or living in a shelter (including now)?: No       Allergies  Latex; Lurasidone hcl; Peanut; Prednisone; Zolpidem; Armodafinil; Quetiapine; Aripiprazole; Azelastine; Divalproex; Divalproex sodium; Gloves, latex with aloe vera; Gluten protein; Methylphenidate; Mirtazapine; Morphine; Nefazodone; Oxcarbazepine; Peanut oil; Pramipexole; Propranolol; Risperidone; Topiramate; Zonisamide; Latex, natural rubber; and Sulfa (sulfonamide antibiotics)    Medications  Encounter Medications[4]    Review of Systems  All 10 review of systems were addressed and only pertinent positives are listed in history of present illness     Physical Exam  BP 110/76   Pulse 73   Temp 36.4 C (97.6 F)   Resp 16   Ht 1.676 m   Wt 61.7 kg   SpO2 98%   BMI 21.95 kg/m   Constitutional: awake, alert, orientated, in no apparent distress  Abdomen: soft and non-tender abdomen.   GYN: reducible R inguinal hernia. Skin around the area of herniation shows no redness, swelling, or signs of infection.    Assessment and Plan  68 y/o F, hx of SVT, hypothyroidism, and total vaginal hysterectomy, here for surgical evaluation of R inguinal hernia.   No sx of incarcerations or strangulations at this time.   Pt understands that this is a risk of inguinal hernias and was counseled that the only treatment for inguinal hernias would be surgical repair.  We did discuss the risks including bleeding, infection, mesh infection, nerve injury.  We also discussed the role of open versus robotic assisted inguinal hernia pair with mesh.  Pt expresses that she would like to schedule an elective robotic assisted inguinal hernia repair. Details of the procedure were explained to her, including the necessity of refraining from lifting heavy things for at least 6 weeks postsurgically.   Pt understands and agrees with the plan.   Pt is not on any anticoagulation, has not had  postsurgical complications in the past.     Rayford Po, TUSM M3     I had a detailed discussion with the patient about All questions were answered and the patient stated good understanding.         [1] History reviewed. No pertinent past medical history.  [  2]   Past Surgical History:  Procedure Laterality Date    US  ASPIRATION INJECTION INTERMEDIATE JOINT  01/08/2018    US  ASPIRATION INJECTION INTERMEDIATE JOINT    US  ASPIRATION INJECTION INTERMEDIATE JOINT  06/29/2017    US  ASPIRATION INJECTION INTERMEDIATE JOINT   [3] No family history on file.  [4]   Outpatient Encounter Medications as of 12/16/2023   Medication Sig Dispense Refill    acetaminophen (Tylenol 8 Hour) 650 mg ER tablet Take 650 mg by mouth every 8 (eight) hours if needed.      albuterol 90 mcg/actuation inhaler TAKE 2 PUFFS BY MOUTH EVERY 4 HOURS AS NEEDED/Dx: J45.909 asthma      amoxicillin (Amoxil) 500 mg tablet amoxicillin 500 mg tablet   4 TABLETS BY MOUTH ONE TIME, ONE HOUR PRIOR TO DENTAL PROCEDURE      aspirin 81 mg EC tablet Take 81 mg by mouth.      benzonatate (Tessalon) 100 mg capsule Take 1 capsule 3 times a day by oral route for 7 days.      bisacodyl (Dulcolax) 5 mg EC tablet Take 10 mg by mouth if needed each day.      calcium carbonate (Os-Cal) 500 mg calcium (1,250 mg) chewable tablet Chew 1 tablet once daily.      cholecalciferol (Vitamin D-3) 25 MCG (1000 UT) capsule Take 1 capsule by mouth once daily.      ciprofloxacin-dexamethasone (Ciprodex) otic suspension Ciprodex 0.3 %-0.1 % ear drops,suspension      docusate sodium (Colace) 100 mg capsule Take 100 mg by mouth once daily.      EPINEPHrine (Epipen) 0.3 mg/0.3 mL auto-injector Inject entire contents of epinephrine auto-injector into thigh.      esomeprazole (NexIUM) 20 mg DR capsule Take 1 capsule by mouth once daily.      fexofenadine (Allegra Allergy) 180 mg tablet Take 1 tablet by mouth once daily.      fluconazole (Diflucan) 150 mg tablet TAKE 1 TABLET BY MOUTH EVERY DAY  AS DIRECTED FOR 1 DAY      fluoride, sodium, (PreviDent 5000 Booster Plus) 1.1 % paste       folic acid (Folvite) 800 mcg tablet Take 1,333 mcg by mouth in the morning.      hylan (Synvisc) 48 mg/6 mL injection Inject 48 mg into the joint.      ketoconazole (NIZOral) 2 % shampoo SHAMPOO EVERY OTHER DAY      levothyroxine (Synthroid, Levoxyl) 112 mcg tablet Take by mouth 3 (three) times a week. Pt is taking the medication Friday, Saturday, Sunday      levothyroxine (Synthroid, Levoxyl) 125 mcg tablet Take 125 mcg by mouth 3 (three) times a week. Pt taking the medication Monday, Tuesday, wednesday      lithium ER (Lithobid) 300 mg 12 hr tablet Take 600 mg by mouth in the morning.      magnesium 200 mg tablet Take 100 mg by mouth in the morning.      meloxicam (Mobic) 7.5 mg tablet Take 7.5 mg by mouth.      metroNIDAZOLE (Metrocream) 0.75 % cream Apply topically twice daily.      minocycline 100 mg capsule Take by mouth once daily.      ondansetron ODT (Zofran-ODT) 4 mg disintegrating tablet Take 4 mg by mouth every 8 (eight) hours if needed.      polyethylene glycol (Glycolax) 17 gram packet Take 17 g by mouth if needed each day.      rosuvastatin (  Crestor) 5 mg tablet Take 5 mg by mouth once daily.      senna 8.6 mg tablet Take 1 tablet by mouth at bedtime.      therapeutic multivitamin-iron-minerals (Theragran-M) tablet Take 1 tablet by mouth in the morning.       No facility-administered encounter medications on file as of 12/16/2023.

## 2023-12-16 NOTE — H&P (View-Only) (Signed)
 Date of Service: 12/16/2023    Patient: Shelly Thompson  DOB: January 30, 1955   MRN#: [id number]      The patient was seen by the resident and medical student under my supervision.  I agree with the assessment and evaluation/work-up.  I personally saw the patient, pertinent records and imaging, and discussed all aspects of the medical decision making.    Gladis Eagles MD       History of Present Illness  68 y/o F with significant PMH of arthritis, SVT, hypothyroidism, and surgical hx including total vaginal hysterectomy and salpingectomy with anterior vaginal repair, referred by OBGYN for evaluation of a R inguinal hernia x 3 weeks.   Patient was carrying laundry up the stairs of her house when she suddenly felt a pop, accompanied by severe pain and a visible mass protruding from the area of pain. States the pain has greatly subsided but she still feels a twinge there.   Hernia is reducible. Pt states she feels it pop out more when she lifts heavy things or strains, and notices more at the end of the day.   Has not noticed any changes in appearance to the area; no increase in size, changes in color, or concerning alterations.   Reports no trauma to the area. Not associated with any changes in BM.   Has also not had any fevers/chills or any infectious sx.   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   Pt states she enjoys long walks and playing with her grandchildren, feels hernia has been limiting her in this regards.   Has never had any postsurgical complications with past surgeries. Not on any anticoagulation.       Past Medical History  Medical History[1]    Past Surgical History  Surgical History[2]    Family History  Family History[3]    Social History  Social History     Socioeconomic History    Marital status: Divorced     Spouse name: Not on file    Number of children: Not on file    Years of  education: Not on file    Highest education level: Not on file   Occupational History    Not on file   Tobacco Use    Smoking status: Never    Smokeless tobacco: Never   Substance and Sexual Activity    Alcohol use: Never    Drug use: Defer    Sexual activity: Defer   Other Topics Concern    Not on file   Social History Narrative    Not on file     Social Determinants of Health     Financial Resource Strain: Low Risk  (11/21/2022)    Received from [facility]    Overall Financial Resource Strain (CARDIA)     Difficulty of Paying Living Expenses: Not hard at all   Recent Concern: Financial Resource Strain - High Risk (11/19/2022)    Received from [facility]    Overall Financial Resource Strain (CARDIA)     Difficulty of Paying Living Expenses: Very hard   Food Insecurity: Unknown (11/21/2022)    Received from [facility]    Hunger Vital Sign     Within the past 12 months, you worried that your food would run out before you got the money to buy more.:  Never true     Ran Out of Food in the Last Year: Not on file   Transportation Needs: No Transportation Needs (11/22/2022)    Received from [facility]    PRAPARE - Transportation     Lack of Transportation (Medical): No     Lack of Transportation (Non-Medical): No   Physical Activity: Not on file   Stress: Not on file   Social Connections: Not on file   Intimate Partner Violence: Unknown (11/21/2022)    Received from [facility]    Humiliation, Afraid, Rape, and Kick questionnaire     Within the last year, have you been afraid of your partner or ex-partner?: No     Emotionally Abused: Not on file     Physically Abused: Not on file     Sexually Abused: Not on file   Housing Stability: Unknown (11/21/2022)    Received from [facility]    Housing Stability Vital Sign     In the last 12 months, was there a time when you were not able to pay the mortgage or rent on time?: No     Number of Times  Moved in the Last Year: Not on file     At any time in the past 12 months, were you homeless or living in a shelter (including now)?: No       Allergies  Latex; Lurasidone hcl; Peanut; Prednisone; Zolpidem; Armodafinil; Quetiapine; Aripiprazole; Azelastine; Divalproex; Divalproex sodium; Gloves, latex with aloe vera; Gluten protein; Methylphenidate; Mirtazapine; Morphine; Nefazodone; Oxcarbazepine; Peanut oil; Pramipexole; Propranolol; Risperidone; Topiramate; Zonisamide; Latex, natural rubber; and Sulfa (sulfonamide antibiotics)    Medications  Encounter Medications[4]    Review of Systems  All 10 review of systems were addressed and only pertinent positives are listed in history of present illness     Physical Exam  BP 110/76   Pulse 73   Temp 36.4 C (97.6 F)   Resp 16   Ht 1.676 m   Wt 61.7 kg   SpO2 98%   BMI 21.95 kg/m   Constitutional: awake, alert, orientated, in no apparent distress  Abdomen: soft and non-tender abdomen.   GYN: reducible R inguinal hernia. Skin around the area of herniation shows no redness, swelling, or signs of infection.    Assessment and Plan  68 y/o F, hx of SVT, hypothyroidism, and total vaginal hysterectomy, here for surgical evaluation of R inguinal hernia.   No sx of incarcerations or strangulations at this time.   Pt understands that this is a risk of inguinal hernias and was counseled that the only treatment for inguinal hernias would be surgical repair.  We did discuss the risks including bleeding, infection, mesh infection, nerve injury.  We also discussed the role of open versus robotic assisted inguinal hernia pair with mesh.  Pt expresses that she would like to schedule an elective robotic assisted inguinal hernia repair. Details of the procedure were explained to her, including the necessity of refraining from lifting heavy things for at least 6 weeks postsurgically.   Pt understands and agrees with the plan.   Pt is not on any anticoagulation, has not had  postsurgical complications in the past.     Rayford Po, TUSM M3     I had a detailed discussion with the patient about All questions were answered and the patient stated good understanding.         [1] History reviewed. No pertinent past medical history.  [  2]   Past Surgical History:  Procedure Laterality Date    US  ASPIRATION INJECTION INTERMEDIATE JOINT  01/08/2018    US  ASPIRATION INJECTION INTERMEDIATE JOINT    US  ASPIRATION INJECTION INTERMEDIATE JOINT  06/29/2017    US  ASPIRATION INJECTION INTERMEDIATE JOINT   [3] No family history on file.  [4]   Outpatient Encounter Medications as of 12/16/2023   Medication Sig Dispense Refill    acetaminophen (Tylenol 8 Hour) 650 mg ER tablet Take 650 mg by mouth every 8 (eight) hours if needed.      albuterol 90 mcg/actuation inhaler TAKE 2 PUFFS BY MOUTH EVERY 4 HOURS AS NEEDED/Dx: J45.909 asthma      amoxicillin (Amoxil) 500 mg tablet amoxicillin 500 mg tablet   4 TABLETS BY MOUTH ONE TIME, ONE HOUR PRIOR TO DENTAL PROCEDURE      aspirin 81 mg EC tablet Take 81 mg by mouth.      benzonatate (Tessalon) 100 mg capsule Take 1 capsule 3 times a day by oral route for 7 days.      bisacodyl (Dulcolax) 5 mg EC tablet Take 10 mg by mouth if needed each day.      calcium carbonate (Os-Cal) 500 mg calcium (1,250 mg) chewable tablet Chew 1 tablet once daily.      cholecalciferol (Vitamin D-3) 25 MCG (1000 UT) capsule Take 1 capsule by mouth once daily.      ciprofloxacin-dexamethasone (Ciprodex) otic suspension Ciprodex 0.3 %-0.1 % ear drops,suspension      docusate sodium (Colace) 100 mg capsule Take 100 mg by mouth once daily.      EPINEPHrine (Epipen) 0.3 mg/0.3 mL auto-injector Inject entire contents of epinephrine auto-injector into thigh.      esomeprazole (NexIUM) 20 mg DR capsule Take 1 capsule by mouth once daily.      fexofenadine (Allegra Allergy) 180 mg tablet Take 1 tablet by mouth once daily.      fluconazole (Diflucan) 150 mg tablet TAKE 1 TABLET BY MOUTH EVERY DAY  AS DIRECTED FOR 1 DAY      fluoride, sodium, (PreviDent 5000 Booster Plus) 1.1 % paste       folic acid (Folvite) 800 mcg tablet Take 1,333 mcg by mouth in the morning.      hylan (Synvisc) 48 mg/6 mL injection Inject 48 mg into the joint.      ketoconazole (NIZOral) 2 % shampoo SHAMPOO EVERY OTHER DAY      levothyroxine (Synthroid, Levoxyl) 112 mcg tablet Take by mouth 3 (three) times a week. Pt is taking the medication Friday, Saturday, Sunday      levothyroxine (Synthroid, Levoxyl) 125 mcg tablet Take 125 mcg by mouth 3 (three) times a week. Pt taking the medication Monday, Tuesday, wednesday      lithium ER (Lithobid) 300 mg 12 hr tablet Take 600 mg by mouth in the morning.      magnesium 200 mg tablet Take 100 mg by mouth in the morning.      meloxicam (Mobic) 7.5 mg tablet Take 7.5 mg by mouth.      metroNIDAZOLE (Metrocream) 0.75 % cream Apply topically twice daily.      minocycline 100 mg capsule Take by mouth once daily.      ondansetron ODT (Zofran-ODT) 4 mg disintegrating tablet Take 4 mg by mouth every 8 (eight) hours if needed.      polyethylene glycol (Glycolax) 17 gram packet Take 17 g by mouth if needed each day.      rosuvastatin (  Crestor) 5 mg tablet Take 5 mg by mouth once daily.      senna 8.6 mg tablet Take 1 tablet by mouth at bedtime.      therapeutic multivitamin-iron-minerals (Theragran-M) tablet Take 1 tablet by mouth in the morning.       No facility-administered encounter medications on file as of 12/16/2023.

## 2023-12-18 ENCOUNTER — Ambulatory Visit: Payer: MEDICARE | Attending: Surgery

## 2023-12-30 NOTE — Preprocedure Instructions (Signed)
 No outpatient medications have been marked as taking for the 01/07/24 encounter [facility] Encounter).            Medication Instructions  You may have to stop taking any of the following pain or anti-inflammatory drugs one week prior to your surgery:  aspirin, ibuprofen (Advil, Motrin), and naproxen (Naprosyn).  Please ask your surgeon about these medications if haven't already received directions.  You may take acetaminophen (Tylenol) for pain/discomfort.  Only take the following medications in the morning on the day of your surgery: Allegra, Synthroid  No vitamins or supplements one week preop.    Do's  DO bathe or shower the night before or the morning of surgery.    DO wear loose, comfortable clothing to the hospital on the day of surgery.  DO bring a case for your eye glasses, contact lenses or hearing aids.    Do Not's  DO NOT eat any solid foods after midnight the night before surgery.  You may drink CLEAR liquids (water, Gatorade, apple juice, black coffee, black tea) up to 2 hours before your scheduled surgery time (NO milk, orange juice, smoothies, or anything cloudy).    DO NOT use breath mints or chew gum.   DO NOT use any deodorants, creams, colognes, powders, lotions, make-up, moisturizers, hair clips, barrettes or hair products (hair gel, hair spray or mousse).    DO NOT apply nail polish.  Please remove all existing nail polish.  It can affect the anesthesia monitoring equipment.   Please remove any/all piercings.   DO NOT shave near where you will have surgery for up to 2 days before the day of surgery.  Shaving can irritate the skin and can cause infection.  DO NOT bring valuables.  Leave all jewelry at home.  DO NOT plan to drive yourself home from the hospital.  You must arrange for transportation home by a responsible adult.     Other Instructions  Please check in at the Surgical Admission Check in - [place], 5th floor, [address]  [place] [address].  Please call [phone]) to confirm  your arrival time after 2pm, the day before your surgery.

## 2024-01-06 NOTE — Anesthesia Preprocedure Evaluation (Addendum)
 Patient: Shelly Thompson    Procedure Information       Date/Time: 01/07/24 1410    Procedure: Repair, Hernia, Inguinal, Unilateral, Robot-Assisted (Right: Abdomen)    Location: [facility] OR 17 / [facility] Operating Room    Surgeons: Gladis Eagles, MD          [phone] with SVT s/p Holter, GERD, mild asthma, depression/anxiety, Hashimotos, mild to moderate mitral and tricuspid valve regurgitation undergoing R sided inguinal hernia repair.    Of note, she had an episode of cehst tightness and jaw discomfort during MRI 09/2023 for which she was recommended to obtain stress test and echocardiogram.    Prior airway 10/24/2016:  DL, Mac 3, grade 1 view, intubated on first attempt. Maskable    Diagnosis  No diagnosis found.  Allergies  Allergies   Allergen Reactions    Latex Hives, Itching, Other, Rash and Swelling     Contact redness    Lurasidone Hcl Other     Other Reaction(s): Not available    ~pain, low grade fever~, remitted as soon as stopped; also restless     lurasidone hydrochloride    Peanut Other, Runny nose and Anaphylaxis     Other Reaction(s): Not available    peanut allergenic extract    Allergy testing found to have peanut allergy    Runny nose    Prednisone Anaphylaxis, Other, Swelling and Unknown     Generic Allergy: Prednisone    patient reports makes her blow up within 45   minutes.    EDEMA    Other Reaction(s): Not available    prednisone    Zolpidem Other, Shortness of breath and Swelling     Other Reaction(s): Not available, Unknown    dysaesthesia    Ambien CR   Since d/c of Ambien CR, most of her somatic sxs, including the burning parasthesias, seem to have resolved.Fortunately still sleeping well, only needing xanax.somatic sxs are related to ambien, particularly given it seems worse at night.   may have resulted in breathing difficulties.    ill    Ambien    zolpidem tartrate    Patient developed CP. Ed work up normal.    Armodafinil Headache     Other Reaction(s): Mental Status Change, Not  available    irritable    armodafinil    Quetiapine Other     Other Reaction(s): Mental Status Change, Not available    dizzy too sedating and makes her feel more depressed, irritable.    quetiapine    Aripiprazole Other     Other Reaction(s): Not available    edema      aripiprazole    Azelastine Unknown     Other Reaction(s): Not available    azelastine    Divalproex      Other Reaction(s): Not available    Depakote    Divalproex Sodium Other     Other Reaction(s): Not available    red painful face; dysaesthesia pruritis     divalproex sodium    Gadolinium-Containing Contrast Media Other     Felt body was on fire pain on arm where IV was placed.    Gloves, Latex With Aloe Vera      Other Reaction(s): Not available    latex gloves    Gluten Protein GI intolerance, Other and Nausea Only    Methylphenidate Other     agitating    Mirtazapine Other     Other Reaction(s): Not available    agitation  mirtazapine    Morphine      Other Reaction(s): Mental Status Change    Nefazodone Other     Other Reaction(s): Not available    edema, palpitations     nefazodone hydrochloride    Oxcarbazepine Other     Other Reaction(s): Not available    pain     oxcarbazepine    Peanut Oil Other     Other Reaction(s): Not available    Increaed wax builldup with ear drops containing peanut oil    Pramipexole Other     Other Reaction(s): Not available    extreme sedation in combination with maoi despite ultra low dose mirapex     pramipexole    Propranolol Other     Other Reaction(s): Not available    lethargy     propranolol hydrochloride    Risperidone Other     Other Reaction(s): Not available    palpitations and facial redness     risperidone    Topiramate Other     Other Reaction(s): Not available    seemed to agitate     topiramate    Zonisamide Other     Other Reaction(s): Not available    red neck ?sore throat     zonisamide    Latex, Natural Rubber      Other Reaction(s): Unknown    Patient had CP- work up WNL.    Sulfa  (Sulfonamide Antibiotics) Rash     Past Medical History  Past Medical History:   Diagnosis Date    Arthritis     Fibromyalgia     Hypothyroidism     Inguinal hernia     Mood disorder     Tachycardia      Problem List  Patient Active Problem List   Diagnosis    Non-recurrent unilateral inguinal hernia without obstruction or gangrene     Past Surgical History  Past Surgical History:   Procedure Laterality Date    HIP ARTHROPLASTY Left 11/21/2022    KNEE ARTHROPLASTY Left     TOTAL VAGINAL HYSTERECTOMY      w/ salpingectomy, anterior vaginal repair    US  ASPIRATION INJECTION INTERMEDIATE JOINT  01/08/2018    US  ASPIRATION INJECTION INTERMEDIATE JOINT    US  ASPIRATION INJECTION INTERMEDIATE JOINT  06/29/2017    US  ASPIRATION INJECTION INTERMEDIATE JOINT     Social History  Social History     Socioeconomic History    Marital status: Divorced     Spouse name: None    Number of children: None    Years of education: None    Highest education level: None   Occupational History    None   Tobacco Use    Smoking status: Never    Smokeless tobacco: Never   Substance and Sexual Activity    Alcohol use: Never    Drug use: Defer    Sexual activity: Defer   Other Topics Concern    None   Social History Narrative    None     Social Determinants of Health     Financial Resource Strain: Low Risk  (11/21/2022)    Received from [facility]    Overall Financial Resource Strain (CARDIA)     Difficulty of Paying Living Expenses: Not hard at all   Recent Concern: Financial Resource Strain - High Risk (11/19/2022)    Received from [facility]    Overall Financial Resource Strain (CARDIA)     Difficulty of Paying  Living Expenses: Very hard   Food Insecurity: Unknown (11/21/2022)    Received from [facility]    Hunger Vital Sign     Within the past 12 months, you worried that your food would run out before you got the money to buy more.: Never true     Ran Out of Food in the Last Year: Not on file    Transportation Needs: No Transportation Needs (11/22/2022)    Received from [facility]    PRAPARE - Transportation     Lack of Transportation (Medical): No     Lack of Transportation (Non-Medical): No   Physical Activity: Not on file   Stress: Not on file   Social Connections: Not on file   Intimate Partner Violence: Unknown (11/21/2022)    Received from [facility]    Humiliation, Afraid, Rape, and Kick questionnaire     Within the last year, have you been afraid of your partner or ex-partner?: No     Emotionally Abused: Not on file     Physically Abused: Not on file     Sexually Abused: Not on file   Housing Stability: Unknown (11/21/2022)    Received from [facility]    Housing Stability Vital Sign     In the last 12 months, was there a time when you were not able to pay the mortgage or rent on time?: No     Number of Times Moved in the Last Year: Not on file     At any time in the past 12 months, were you homeless or living in a shelter (including now)?: No       Medications:  Prior to Admission  No medications prior to admission.     Scheduled    Continuous    PRN      Most Recent Labs  No results found for: NA, K, CL, CO2, BUN, CREATININE, GLUCOSE, WBC, HGB, HCT, PLT, PT, INR, APTT, ABORH  Pregnancy Status  No results found for: PREGTESTUR, PREGSERUM, HCG, HCGQUANT, UHCGQL    Cardiac  No results found for this or any previous visit (from the past 4464 hours).    No echocardiogram results found for the past 12 months   Relevant Problems   No relevant active problems       Clinical information reviewed:   Tobacco  Allergies  Meds   Med Hx  Surg Hx   Fam Hx  Soc Hx         Physical Exam    Anesthesia Plan    ASA 3     general     Airway: ETT  Monitoring: standard monitors  Postoperative Pain Control: IV/PO analgesics                 Additional Equipment Requests

## 2024-01-07 MED ORDER — SUGAMMADEX 100 MG/ML INTRAVENOUS SOLUTION
100 | INTRAVENOUS | Status: DC | PRN
Start: 2024-01-07 — End: 2024-01-07
  Administered 2024-01-07: 23:00:00 200 via INTRAVENOUS

## 2024-01-07 MED ORDER — EPHEDRINE SULFATE 50 MG/ML INTRAVENOUS SOLUTION WRAPPER
50 | Freq: Once | INTRAVENOUS | Status: DC | PRN
Start: 2024-01-07 — End: 2024-01-07

## 2024-01-07 MED ORDER — ONDANSETRON HCL (PF) 4 MG/2 ML INJECTION SOLUTION
4 | INTRAMUSCULAR | Status: DC | PRN
Start: 2024-01-07 — End: 2024-01-07
  Administered 2024-01-07: 23:00:00 4 via INTRAVENOUS

## 2024-01-07 MED ORDER — ROCURONIUM 10 MG/ML INTRAVENOUS SOLUTION
10 | INTRAVENOUS | Status: DC | PRN
Start: 2024-01-07 — End: 2024-01-07
  Administered 2024-01-07: 22:00:00 50 via INTRAVENOUS
  Administered 2024-01-07: 22:00:00 10 via INTRAVENOUS

## 2024-01-07 MED ORDER — DEXMEDETOMIDINE 80 MCG/20 ML (4 MCG/ML) IN 0.9 % SODIUM CHLORIDE IV
4 | INTRAVENOUS | Status: DC | PRN
Start: 2024-01-07 — End: 2024-01-07
  Administered 2024-01-07: 23:00:00 12 via INTRAVENOUS

## 2024-01-07 MED ORDER — HALOPERIDOL LACTATE 5 MG/ML INJECTION SOLUTION
5 | Freq: Once | INTRAMUSCULAR | Status: DC | PRN
Start: 2024-01-07 — End: 2024-01-07

## 2024-01-07 MED ORDER — FENTANYL (PF) 50 MCG/ML INJECTION SOLUTION
50 | INTRAMUSCULAR | Status: DC | PRN
Start: 2024-01-07 — End: 2024-01-07
  Administered 2024-01-07: 22:00:00 50 via INTRAVENOUS

## 2024-01-07 MED ORDER — BUPIVACAINE-EPINEPHRINE (PF) 0.25 %-1:200,000 INJECTION SOLUTION
0.25 | INTRAMUSCULAR | Status: AC
Start: 2024-01-07 — End: 2024-01-07

## 2024-01-07 MED ORDER — OXYCODONE 5 MG TABLET
5 | Freq: Four times a day (QID) | ORAL | Status: DC | PRN
Start: 2024-01-07 — End: 2024-01-07

## 2024-01-07 MED ORDER — PROPOFOL 10 MG/ML INTRAVENOUS EMULSION
10 | INTRAVENOUS | Status: DC | PRN
Start: 2024-01-07 — End: 2024-01-07
  Administered 2024-01-07: 22:00:00 100 via INTRAVENOUS
  Administered 2024-01-07: 23:00:00 20 via INTRAVENOUS
  Administered 2024-01-07: 22:00:00 40 via INTRAVENOUS

## 2024-01-07 MED ORDER — DOCUSATE SODIUM 100 MG CAPSULE
100 | ORAL_CAPSULE | Freq: Two times a day (BID) | ORAL | 0 refills | 29.00000 days | Status: DC
Start: 2024-01-07 — End: 2024-02-05

## 2024-01-07 MED ORDER — OXYCODONE 5 MG TABLET
5 | ORAL_TABLET | Freq: Four times a day (QID) | ORAL | 0 refills | 5.00000 days | Status: DC | PRN
Start: 2024-01-07 — End: 2024-02-05

## 2024-01-07 MED ORDER — FENTANYL (PF) 50 MCG/ML INJECTION SOLUTION
50 | INTRAMUSCULAR | Status: AC
Start: 2024-01-07 — End: 2024-01-07

## 2024-01-07 MED ORDER — HYDROMORPHONE 1 MG/ML INJECTION WRAPPER
1 | INTRAMUSCULAR | Status: DC
Start: 2024-01-07 — End: 2024-01-07

## 2024-01-07 MED ORDER — HYDROMORPHONE 1 MG/ML INJECTION WRAPPER
1 | INTRAMUSCULAR | Status: AC | PRN
Start: 2024-01-07 — End: 2024-01-07
  Administered 2024-01-07 (×5): 0.2 mg via INTRAVENOUS

## 2024-01-07 MED ORDER — FENTANYL (PF) 50 MCG/ML INJECTION SOLUTION
50 | INTRAMUSCULAR | Status: DC
Start: 2024-01-07 — End: 2024-01-07

## 2024-01-07 MED ORDER — OXYCODONE 5 MG TABLET
5 | ORAL_TABLET | Freq: Three times a day (TID) | ORAL | 0 refills | 4.00000 days | Status: AC | PRN
Start: 2024-01-07 — End: 2024-01-14

## 2024-01-07 MED ORDER — ACETAMINOPHEN 325 MG TABLET
325 | Freq: Once | ORAL | Status: AC
Start: 2024-01-07 — End: 2024-01-07
  Administered 2024-01-07: 19:00:00 975 mg via ORAL

## 2024-01-07 MED ORDER — SODIUM CHLORIDE 0.9 % (FLUSH) INJECTION SYRINGE
Freq: Three times a day (TID) | INTRAMUSCULAR | Status: DC | PRN
Start: 2024-01-07 — End: 2024-01-07

## 2024-01-07 MED ORDER — HEPARIN (PORCINE) 5,000 UNIT/ML INJECTION SOLUTION
5000 | Freq: Once | INTRAMUSCULAR | Status: AC
Start: 2024-01-07 — End: 2024-01-07
  Administered 2024-01-07: 19:00:00 5000 [IU] via SUBCUTANEOUS

## 2024-01-07 MED ORDER — FENTANYL (PF) 50 MCG/ML INJECTION SOLUTION
50 | INTRAMUSCULAR | Status: DC | PRN
Start: 2024-01-07 — End: 2024-01-07
  Administered 2024-01-07 (×2): 25 ug via INTRAVENOUS

## 2024-01-07 MED ORDER — EPHEDRINE SULFATE 50 MG/ML INTRAVENOUS SOLUTION WRAPPER
50 | INTRAVENOUS | Status: DC | PRN
Start: 2024-01-07 — End: 2024-01-07
  Administered 2024-01-07: 22:00:00 10 via INTRAVENOUS

## 2024-01-07 MED ORDER — ONDANSETRON HCL 4 MG TABLET
4 | ORAL_TABLET | Freq: Three times a day (TID) | ORAL | 0 refills | 7.00000 days | Status: AC | PRN
Start: 2024-01-07 — End: 2024-01-14

## 2024-01-07 MED ORDER — ACETAMINOPHEN 325 MG TABLET
325 | ORAL | Status: DC
Start: 2024-01-07 — End: 2024-01-07

## 2024-01-07 MED ORDER — BUPIVACAINE-EPINEPHRINE (PF) 0.25 %-1:200,000 INJECTION SOLUTION
0.25 | INTRAMUSCULAR | Status: DC | PRN
Start: 2024-01-07 — End: 2024-01-07
  Administered 2024-01-07: 23:00:00 30

## 2024-01-07 MED ORDER — CEFAZOLIN 1 GRAM SOLUTION FOR INJECTION
1 | INTRAMUSCULAR | Status: DC | PRN
Start: 2024-01-07 — End: 2024-01-07
  Administered 2024-01-07: 22:00:00 1 via INTRAVENOUS

## 2024-01-07 MED FILL — HEPARIN (PORCINE) 5,000 UNIT/ML INJECTION SOLUTION: 5000 5,000 unit/mL | INTRAMUSCULAR | Qty: 1 | Fill #0

## 2024-01-07 MED FILL — HYDROMORPHONE 1 MG/ML INJECTION WRAPPER: 1 1 mg/mL | INTRAMUSCULAR | Qty: 1 | Fill #0

## 2024-01-07 MED FILL — ACETAMINOPHEN 325 MG TABLET: 325 325 mg | ORAL | Qty: 3 | Fill #0

## 2024-01-07 MED FILL — FENTANYL (PF) 50 MCG/ML INJECTION SOLUTION: 50 50 mcg/mL | INTRAMUSCULAR | Qty: 2 | Fill #0

## 2024-01-07 MED FILL — BUPIVACAINE-EPINEPHRINE (PF) 0.25 %-1:200,000 INJECTION SOLUTION: 0.25 0.25 %-1:200,000 | INTRAMUSCULAR | Qty: 30 | Fill #0

## 2024-01-07 NOTE — Surgeon Attestation (Signed)
 I confirm that I will remain on hospital campus and am available for the planned procedure.       Andy Gauss

## 2024-01-07 NOTE — Anesthesia Procedure Notes (Signed)
 Airway    Date/Time: 01/07/2024 4:34 PM    Location:  OR  Urgency:  Elective  Induction Technique:  Intravenous  Anesthesiologist:  Artemisa Paddy, MD  Resident/CRNA:  Alfonso Mae, MD  Performed by:  Resident/CRNA   Artemisa Paddy, MD  Indications for Airway Management:  Anesthesia  Preoxygenated: Yes    Patient Position:  Sniffing  Mask Ventilation:  Easy mask  Final Airway Type:  Endotracheal airway  Final Endotracheal Airway:  ETT  Cuffed: Yes    Technique Used for Successful ETT Placement:  Direct laryngoscopy and video laryngoscopy  Insertion Site:  Oral  Blade Type:  McGrath  Laryngoscope Blade/Video laryngoscope Blade Size:  3  ETT Size (mm):  7.0   Shohreh Modanlou, MD  Measured from:  Teeth  ETT Depth (cm):  22  Placement Verified by: auscultation and capnometry    Cormack-Lehane Classification:  Grade I - full view of glottis  Number of Attempts at Approach:  1   First attempt with mcintosh 3 poor view, anterior. Grade 1 with mcgrath 3

## 2024-01-07 NOTE — Discharge Instructions (Signed)
 OUTPATIENT SURGERY DISCHARGE INSTRUCTIONS  FOLLOW UP:  - You will need to follow up with Dr. Andy Gauss of [facility], the nurse practitioner for surgical oncology via tele-medicine or in person. Their clinic is located on the 4th floor of the [place] building at [facility]. If you have any questions or need to reschedule your appointment, please call [phone] and ask for general surgery clinic.     HOME MEDICATIONS  Please continue your usual medications as prescribed by your physician.      NEW MEDICATIONS:   - You have no new medications prescribed to you after the surgery, other than a small quantity of pain medications for the immediate postoperative period, as described below.      WOUND CARE:  - Your surgical sites are closed with dissolvable sutures under the skin and covered with a sterile surgical glue. This will fall off on its own; please do not scratch, pick, or peel off the glue. You do not have any sutures or staples which require removal.   - You may shower in 24 hours, but please do not submerge your incision at any time until you are seen for your post-operative follow-up appointment     PAIN MANAGEMENT:  - You can expect to have some pain after surgery or injury. This is normal. The pain is typically worse the first day, and quickly begins to get better.  - Pain is the best indicator of whether or not you are trying to do too much too soon. It should be controlled enough to allow or walking short distances, sitting in a chair, and coughing.  - The goal of pain control is not to stop the pain completely, especially after major injury/surgery. Doing this can lead to medication abuse, often troubling when involving opiates. This is why we only prescribe opioids for a short period of time for breakthrough pain only without refills.     - The best strategy for controlling your pain after injury/surgery is around the clock pain control with Tylenol (acetaminophen) and Motrin  (ibuprofen or Advil). Alternating these medications with each other allows you to maximize your pain control.   Alternating medications: You will take a dose of pain medication every three hours.  -Start by taking 650 mg of Tylenol (2 pills of 325 mg)  -3 hours later take 600 mg of Motrin    -3 hours after taking the Motrin take 650 mg of Tylenol  -3 hours after that take 600 mg of Motrin   IMPORTANT: Do not take more than 4000mg  of Tylenol or 3200mg  of Motrin in a 24-hour period.  - We recommend that you follow this schedule around-the-clock for at least 3 days after surgery/injury, or until you feel that it is no longer needed.   - In addition to the above, you can use heating pads or ice packs on your injuries to help reduce your pain     - For pain that is not relieved by Motrin/Advil and Tylenol, you have been given a prescription for Oxycodone 5mg . You may take as little as  tablet (2.5 mg) or up to 1 tablets (5 mg) up to every 4-6 hours for severe pain not controlled with the above.  - There are no common ingredients with Tylenol or Motrin/Advil so you can use them all safely.   - Possible side effects include nausea, itching, constipation, feeling sleepy or like your thinking is slow.   - You should take the lowest dose  that works for the shortest time that you need it to minimize side effects.   - You may not drive if you are still taking a narcotic pain medication  - If you are still taking your prescription narcotic, you will want to use a stool softener like Colace 100 mg twice a day or Miralax 1 capful in 8 ounces of fluid to drink once a day.      - You should begin to wean your medications in a stepwise fashion as soon as your pain begins to improve. First, decrease the amount and frequency of any narcotic medications. Take 1 tablet, or 0.5 tablets instead of the maximum dose, and then try to go longer in between each dose. You should aim to be done taking narcotic pain medications within 5 days of  discharge.   - Please do not drive or operate heavy machinery while taking narcotic pain medications.   - Once you are no longer taking opiate pain medications, you should next attempt to decrease your ibuprofen usage. It is best to stop daily use of ibuprofen within 2wks of discharge. Consult with your doctor if you feel you need to continue this medication for longer.      ADDITIONAL INSTRUCTIONS:  - Regular diet as tolerated  - No heavy lifting (i.e., no more than a gallon of milk in either hand) for 4 weeks after your surgery.   - Please do not submerge your incision under water until you are seen for your follow-up appointment.      CONTACT YOUR PHYSICIAN IF YOU NOTICE:  -Fever or chills  -Nausea and /or vomiting greater than 3 time or for longer than 24 hours, not relieved with anti-nausea medication  -Diarrhea in large quantity or more than 3-4 bowel movements in 24 hours  -Constipation, no bowel movement in 3 days or longer  -Shortness of breath   -Chest pain  -Redness, swelling, warmth, pain at IV access site  -Severe weakness or tiredness  -Severe pain that pain medication does not improve with medication      In case of questions about care, call your doctor's office.  If after hours, call [phone] and ask [facility] operator to page your doctor or the covering doctor.  In case of emergency, return to [facility] Emergency Department, or go to your nearest emergency department.     If you have concerns regarding anesthetic care, please have the [facility] operator page [phone].    In case of questions about care, call your doctor's office.  If after hours, call [phone] and ask [facility] operator to page your doctor or the covering doctor.  In case of emergency, return to [facility] Emergency Department, or go to your nearest emergency department.    If you have concerns regarding anesthetic care, please have the [facility] operator page  [phone].

## 2024-01-07 NOTE — Anesthesia Postprocedure Evaluation (Signed)
 Patient: Shelly Thompson    Procedure Summary       Date: 01/07/24 Room / Location: [facility] OR 17 / [facility] Operating Room    Anesthesia Start: 1618 Anesthesia Stop: 1800    Procedure: Repair, Hernia, Inguinal, Unilateral, Robot-Assisted (Right: Abdomen) Diagnosis:       Non-recurrent unilateral inguinal hernia without obstruction or gangrene      (Non-recurrent unilateral inguinal hernia without obstruction or gangrene [K40.90])    Surgeons: Gladis Eagles, MD Responsible Provider: Artemisa Paddy, MD    Anesthesia Type: general ASA Status: 3            Anesthesia Type: general    Vitals Value Taken Time   BP 115/61 01/07/24 18:00   Temp 36.2 01/07/24 18:00   Pulse 69 01/07/24 18:00   Resp 11 01/07/24 18:00   SpO2 100 01/07/24 18:00       Anesthesia Post Evaluation Note:    Patient location during evaluation: PACU  Patient participation: able to participate  Level of consciousness: arousable  Cardiovascular and Hydration status: stable  Respiratory Status Stable and Airway Patent: yes  Nausea and Vomiting Control Satisfactory: yes  Pain management: adequate     Vitals reviewed: yes  Unplanned ICU Admission: no      No notable events documented.

## 2024-01-07 NOTE — Interval H&P Note (Signed)
 H&P reviewed. The patient was examined and there are no changes to the H&P.    Shelly Thompson Francois, MD  General Surgery Resident    Please TigerText [facility] On[facility] Adult Res Ward for urgent/emergent consults or questions.   The person who wrote this note may be post-call or in a procedure, and TigerTexting them personally/directly may lead to a delay in care.

## 2024-01-07 NOTE — Background Surgeon Attestation (Signed)
 I confirm that I will remain on hospital campus and am available for the planned procedure.       Shelly Thompson

## 2024-01-07 NOTE — Op Note (Signed)
 Shelly Thompson   [id number]  [id number]    PREOPERATIVE DIAGNOSIS: Right inguinal hernia    POSTOPERATIVE DIAGNOSIS: Right indirect    PROCEDURE: Robotic assisted right inguinal hernia pair with mesh    SURGEON: Gladis Eagles, MD  ASSISTANT: Janeece Francois MD    ANESTHESIA: General endotracheal    ESTIMATED BLOOD LOSS: 10 cc    SPECIMENS: None    DRAINS: None    FINDINGS: Patient with a indirect inguinal hernia on the right.    COMPLICATIONS: None    INDICATIONS: Patient is a 68 year old female who was caring laundry up the stairs and felt a pop in her right inguinal area.  She then developed a bulge.  She was then diagnosed with a right inguinal hernia.  Is been slowly getting bigger and more uncomfortable.  After long discussion with her about the risks, benefits, and options surgery, we opted to proceed with the surgical procedure.      PROCEDURE IN DETAIL  The patient was seen in the preoperative area and again discussed the proposed procedure. The risks, benefits, options, and expected recovery were discussed with the patient. The patient concurred with the proposed plan and gave informed consent.  The site of surgery was properly marked per policy. The patient was taken into the operating room and placed on the operating room table.  Per protocol, preoperative antibiotics were given if needed and DVT prophylaxis.  Patient was then anesthetized and intubated without any difficulties.      The abdomen was then prepped and draped in usual sterile fashion.  At Palmer's point in the left upper quadrant a small skin incision was made.  A Veress needle was inserted and water test performed.  The abdomen was then insufflated.  Under direct visualization, an 8 mm robotic trocar was inserted using a 5 mm laparoscope as an Optiview.  An 8 mm trocar was then placed just to the right of the midline as well as in the right upper quadrant all in a horizontal line.  Upon entering evaluated both the right and left inguinal areas.   The patient had a right inguinal hernia which was not incarcerated.  A right-sided large 3D Bard intermediate grade mesh was then inserted.  A 3-0 Vicryl suture and a 3.0 barbed strata fix suture also inserted under direct visualization.  The robot was then docked and instruments inserted.  The peritoneum was then incised approximately 8 cm from the right inguinal hernia from starting in the midline extending laterally.  This was carefully taken down in the midline pushing the bladder and preperitoneal fat down and identifying the pubic tubercle.   We dissected posterior to the pubic tubercle and approximately 2 cm past midline to be able to place our mesh.  We then continued taking down our peritoneal flap laterally identifying the inferior epigastric vessels and carefully preserving them.  This was taken down leaving the fat on the abdominal wall to preserve the nerves until we got to the psoas muscle.  Patient had no direct component. We then carefully dissected the peritoneum out of the indirect space.  Due to the patient having a prior hysterectomy the round ligament appeared to be transected already or just scarred down with no tension.  We able to completely reduce the hernia sac.  Once this was done the mesh was placed and secured to the pubic tubercle using interrupted 3-0 Vicryl suture.  The peritoneal flap was then reapproximated using a 3.0 barbed strata fix suture.  Hemostasis was well controlled.  Both needles were then removed under direct visualization.  All instruments removed and the robot was undocked.  All trocars were then removed.  0.25% Marcaine  with epinephrine  was injected in the wounds.  Skin incisions were then closed using a 4-0 Monocryl in a subcuticular fashion.  The wounds were cleaned off using wet and dry gauze.  Dermabond was placed over the wound.  The patient was then awoken and extubated without difficulties.  Patient was then taken off the operating table, placed on a stretcher,  and returned to recovery.  At the end of the procedure all needle, sponge counts, and instrument counts were correct.  I was present for the entire case.

## 2024-01-09 NOTE — Telephone Encounter (Signed)
 Received operator notification requesting callback. I spoke with [name]. She recently underwent robot-assisted right inguinal hernia repair. She shares that she has been recovering well, taking only Tylenol  for pain. She shares that she has developed an itchy red rash around all three of her port incisions. It had started from each of the three wounds and is spreading out. There is no drainage, and she has no fevers or chills. She shares her suspicion that this is an allergic reaction, especially so given that she often has reactions to adhesives. I suggested if she had yet tried Benadryl but she has not, as it makes her feel unwell. She wonders if she should remove the Dermabond. I shared that ideally the Dermabond should remain to allow for good wound healing. She agrees and shares that she will continue to monitor it in case it gets worse and may try applying Aquaphor to soothe the skin. We did discuss that this should not be applied onto the Dermabond adhesive itself. She feels reassured at this time and will keep us  apprised of any worsening symptoms.

## 2024-01-27 ENCOUNTER — Ambulatory Visit: Payer: MEDICARE | Attending: Surgery

## 2024-02-05 ENCOUNTER — Ambulatory Visit: Admit: 2024-02-05 | Discharge: 2024-02-05 | Payer: MEDICARE | Attending: Adult Health

## 2024-02-05 DIAGNOSIS — K4090 Unilateral inguinal hernia, without obstruction or gangrene, not specified as recurrent: Principal | ICD-10-CM

## 2024-02-05 NOTE — Progress Notes (Signed)
 General and Surgical Oncology Ambulatory Note      Date of Service: 02/05/2024    Patient: Shelly Thompson  DOB: 1955/05/01   MRN#: [id number]    History of Present Illness  Shelly Thompson is a pleasant 69 y.o. woman who is a patient of Dr. Dale JINNY Rams.  She is here today following a robotic assisted right inguinal hernia repair with mesh on January 07, 2024.  She had a significant skin reaction/rash to Dermabond that has resolved. She is feeling well at this time and denies nausea, vomiting, diarrhea, constipation, fevers, and chills.  She is here for her postoperative appointment.    Past Medical History  Medical History[1]    Past Surgical History  Surgical History[2]    Family History  Family History[3]    Social History  Social History     Tobacco Use    Smoking status: Never    Smokeless tobacco: Never   Substance Use Topics    Alcohol use: Yes      Allergies  Latex; Lurasidone hcl; Peanut; Prednisone; Zolpidem; Adhesive; Armodafinil; Quetiapine; Aripiprazole; Azelastine; Divalproex; Divalproex sodium; Gadolinium-containing contrast media; Gloves, latex with aloe vera; Gluten protein; Methylphenidate; Mirtazapine; Morphine; Nefazodone; Oxcarbazepine; Peanut oil; Pramipexole; Propranolol; Risperidone; Topiramate; Zonisamide; Latex, natural rubber; and Sulfa (sulfonamide antibiotics)    Medications  Encounter Medications[4]    Review of Systems  A review of all 10 systems was performed and the pertinent positives were documented in the History of Present Illness.    Vital Signs  BP 119/81 (BP Location: Left arm, Patient Position: Sitting, BP Cuff Size: Adult)   Pulse 65   Temp 36.4 C (97.5 F) (Temporal)   Resp 16   Ht 1.676 m   Wt 61.2 kg   SpO2 98%   BMI 21.79 kg/m     Physical Exam  Constitutional: awake, alert, orientated, in no apparent distress  Abdomen: soft, non-tender, no masses appreciated, well healed surgical incisions, no drainage, no erythema    Assessment and Plan  Shelly Thompson is doing well  following a robotic assisted right inguinal hernia repair with mesh on January 07, 2024. Her incisions are clean and dry. Her rash from the Dermabond (adhesive) has resolved and Dermabond was added to her allergy list. She understands that she should avoid lifting objects weighing greater than 20 pounds for 6 weeks from the date of surgery. She will contact our office with any further questions or concerns that arise.    Thank you for allowing us  to participate in the care of your patient.  If you have any questions or comments, please do not hesitate to contact our office.        Delon Left, NP           [1]   Past Medical History:  Diagnosis Date    Arthritis     Fibromyalgia     Hypothyroidism     Inguinal hernia     Mood disorder     Tachycardia    [2]   Past Surgical History:  Procedure Laterality Date    HIP ARTHROPLASTY Left 11/21/2022    KNEE ARTHROPLASTY Left     PR LAP,INGUINAL HERNIA REPR,INITIAL Right 01/07/2024    Procedure: Repair, Hernia, Inguinal, Unilateral, Robot-Assisted;  Surgeon: Gladis Eagles, MD;  Location: [facility] OR;  Service: General Surgery    TOTAL VAGINAL HYSTERECTOMY      w/ salpingectomy, anterior vaginal repair    US  ASPIRATION INJECTION INTERMEDIATE JOINT  01/08/2018  US  ASPIRATION INJECTION INTERMEDIATE JOINT    US  ASPIRATION INJECTION INTERMEDIATE JOINT  06/29/2017    US  ASPIRATION INJECTION INTERMEDIATE JOINT   [3] No family history on file.  [4]   Outpatient Encounter Medications as of 02/05/2024   Medication Sig Dispense Refill    acetaminophen  (Tylenol  8 Hour) 650 mg ER tablet Take 650 mg by mouth every 8 (eight) hours if needed.      calcium carbonate (Os-Cal) 500 mg calcium (1,250 mg) chewable tablet Chew 1 tablet once daily.      cholecalciferol (Vitamin D-3) 25 MCG (1000 UT) capsule Take 1 capsule by mouth once daily.      docusate sodium  (Colace) 100 mg capsule Take 1 capsule (100 mg) by mouth twice daily. 60 capsule 0    EPINEPHrine  (Epipen ) 0.3 mg/0.3 mL auto-injector  Inject entire contents of epinephrine  auto-injector into thigh.      fexofenadine (Allegra Allergy) 180 mg tablet Take 1 tablet by mouth once daily.      folic acid (Folvite) 800 mcg tablet Take 1,333 mcg by mouth in the morning.      levothyroxine (Synthroid, Levoxyl) 112 mcg tablet Take by mouth 3 (three) times a week. Pt is taking the medication Friday, Saturday, Sunday      levothyroxine (Synthroid, Levoxyl) 125 mcg tablet Take 125 mcg by mouth four times daily. Pt taking the medication Monday, Tuesday, Wednesday, Thursday      lithium ER (Eskalith) 450 mg 12 hr tablet Take 450 mg by mouth in the morning. PT TAKES ONCE A DAY      magnesium 200 mg tablet Take 100 mg by mouth in the morning.      oxyCODONE  (Roxicodone ) 5 mg immediate release tablet Take 1 tablet (5 mg) by mouth every 6 (six) hours if needed for pain score 7-10 (severe) for up to 5 doses. 5 tablet 0     No facility-administered encounter medications on file as of 02/05/2024.
